# Patient Record
Sex: Female | Born: 1935 | Race: White | Hispanic: No | Smoking: Never smoker
Health system: Southern US, Community
[De-identification: ages and names within clinical notes are randomized; demographics above are authoritative.]

## PROBLEM LIST (undated history)

## (undated) DIAGNOSIS — H8109 Meniere's disease, unspecified ear: Secondary | ICD-10-CM

## (undated) DIAGNOSIS — E7800 Pure hypercholesterolemia, unspecified: Secondary | ICD-10-CM

## (undated) DIAGNOSIS — F329 Major depressive disorder, single episode, unspecified: Secondary | ICD-10-CM

## (undated) DIAGNOSIS — M810 Age-related osteoporosis without current pathological fracture: Secondary | ICD-10-CM

## (undated) DIAGNOSIS — F419 Anxiety disorder, unspecified: Secondary | ICD-10-CM

## (undated) DIAGNOSIS — F32A Depression, unspecified: Secondary | ICD-10-CM

## (undated) DIAGNOSIS — I10 Essential (primary) hypertension: Secondary | ICD-10-CM

---

## 2005-06-19 ENCOUNTER — Ambulatory Visit: Payer: Self-pay | Admitting: Family Medicine

## 2005-11-15 ENCOUNTER — Emergency Department (HOSPITAL_COMMUNITY): Admission: EM | Admit: 2005-11-15 | Discharge: 2005-11-15 | Payer: Self-pay | Admitting: Emergency Medicine

## 2007-11-09 IMAGING — CR DG SHOULDER 2+V*R*
3 series · 3 of 3 positions shown · non-contrast
Comparison: none

CLINICAL DATA: Right shoulder pain.
 RIGHT SHOULDER ? 3 VIEW:

[view not recorded (1 of 3)]
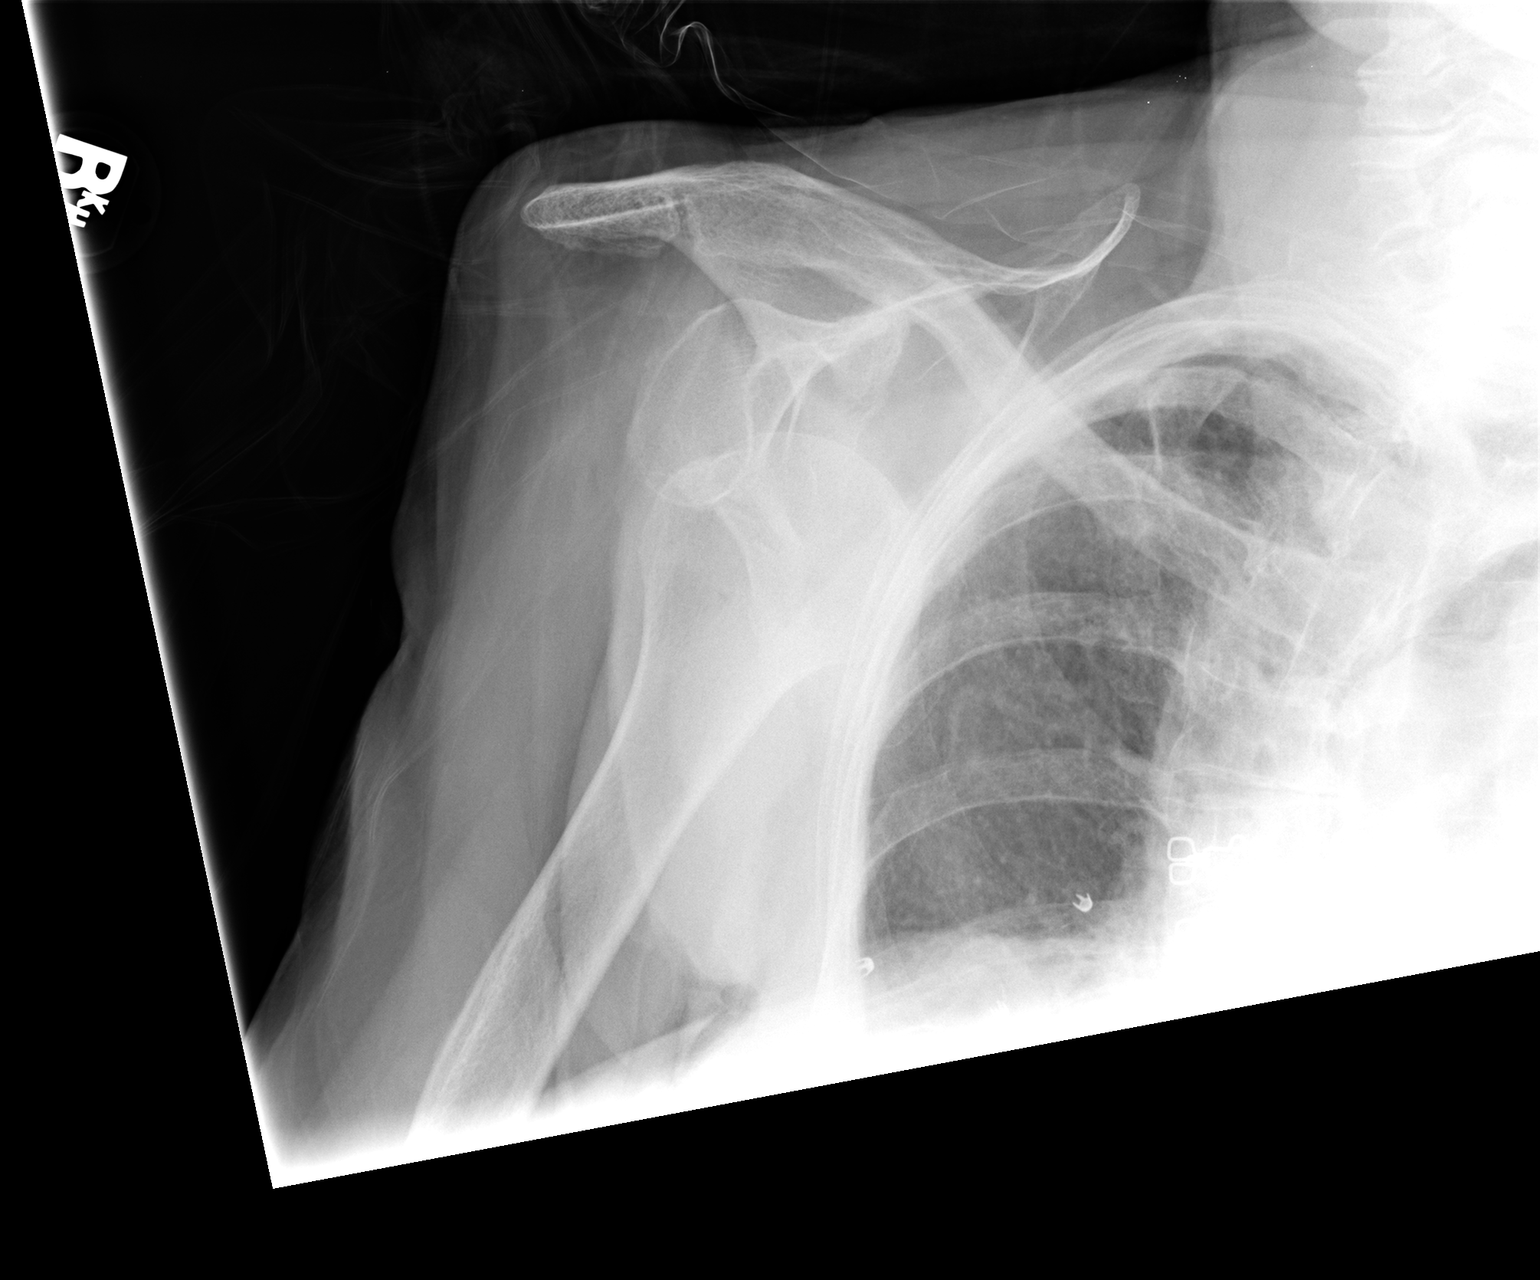

[view not recorded (2 of 3)]
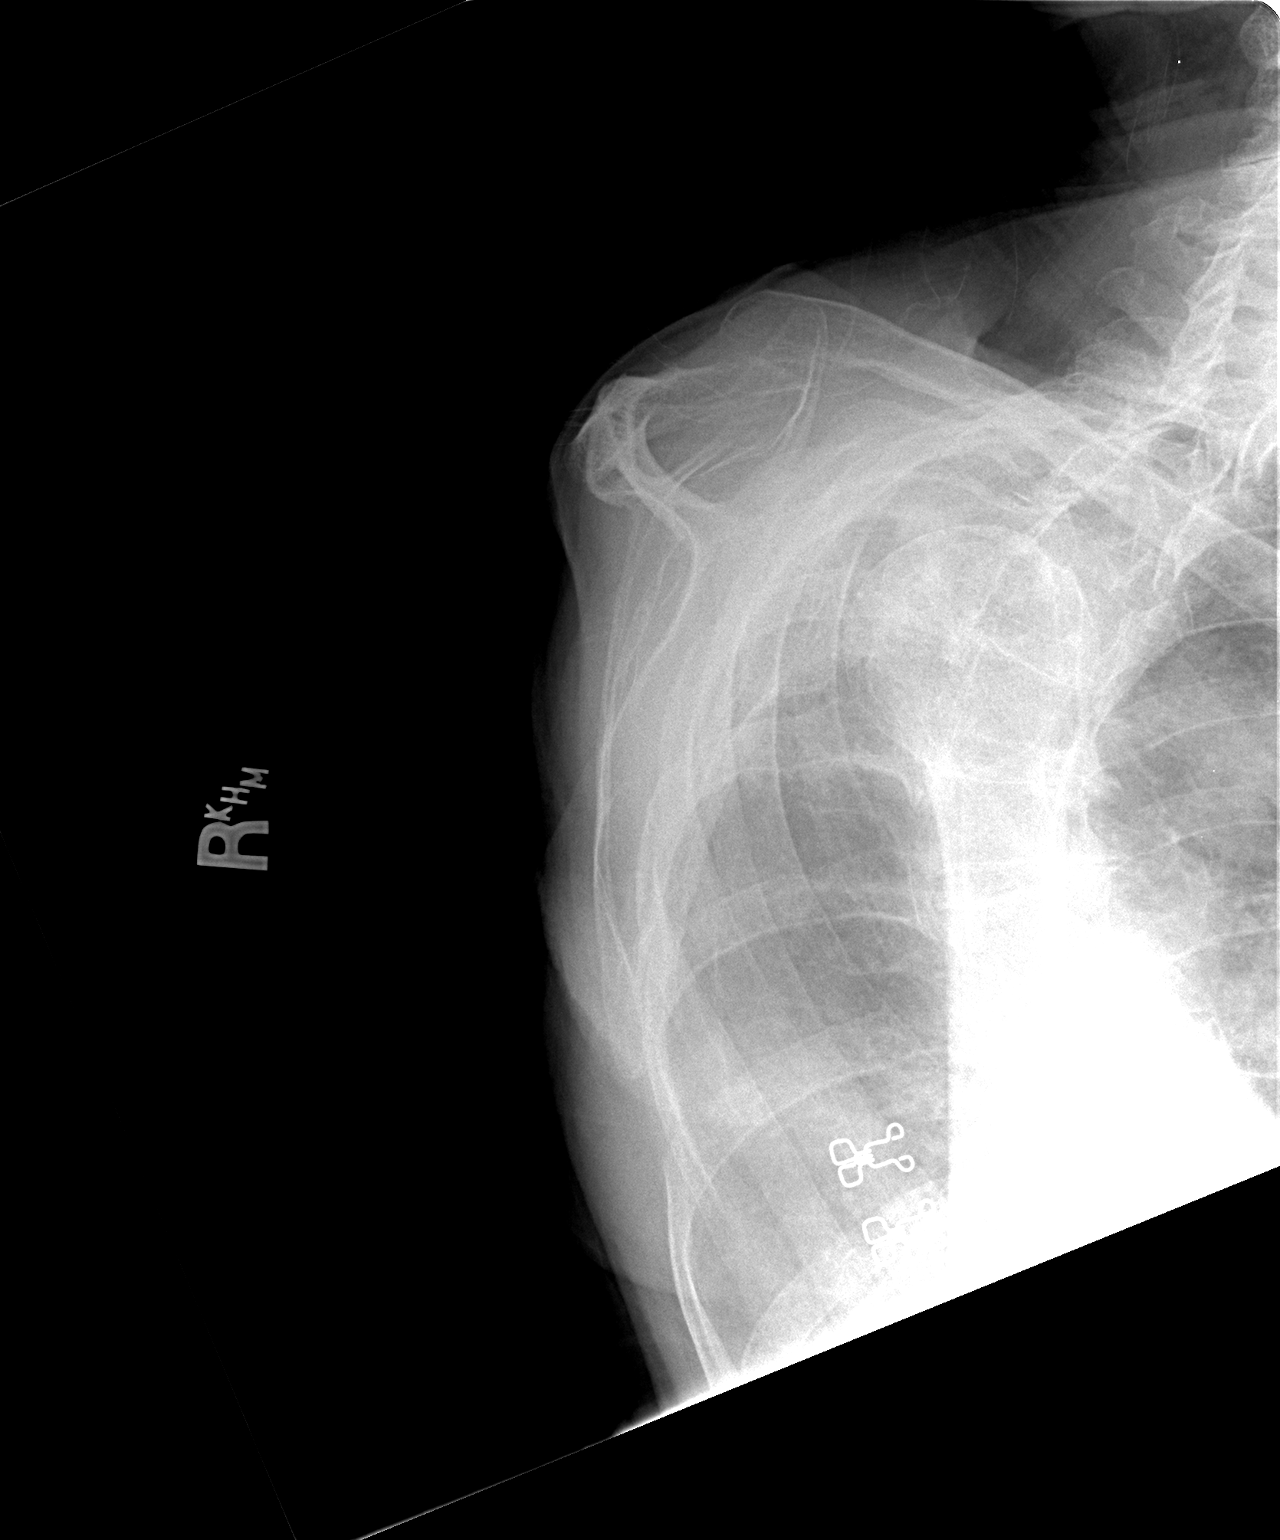

[view not recorded (3 of 3)]
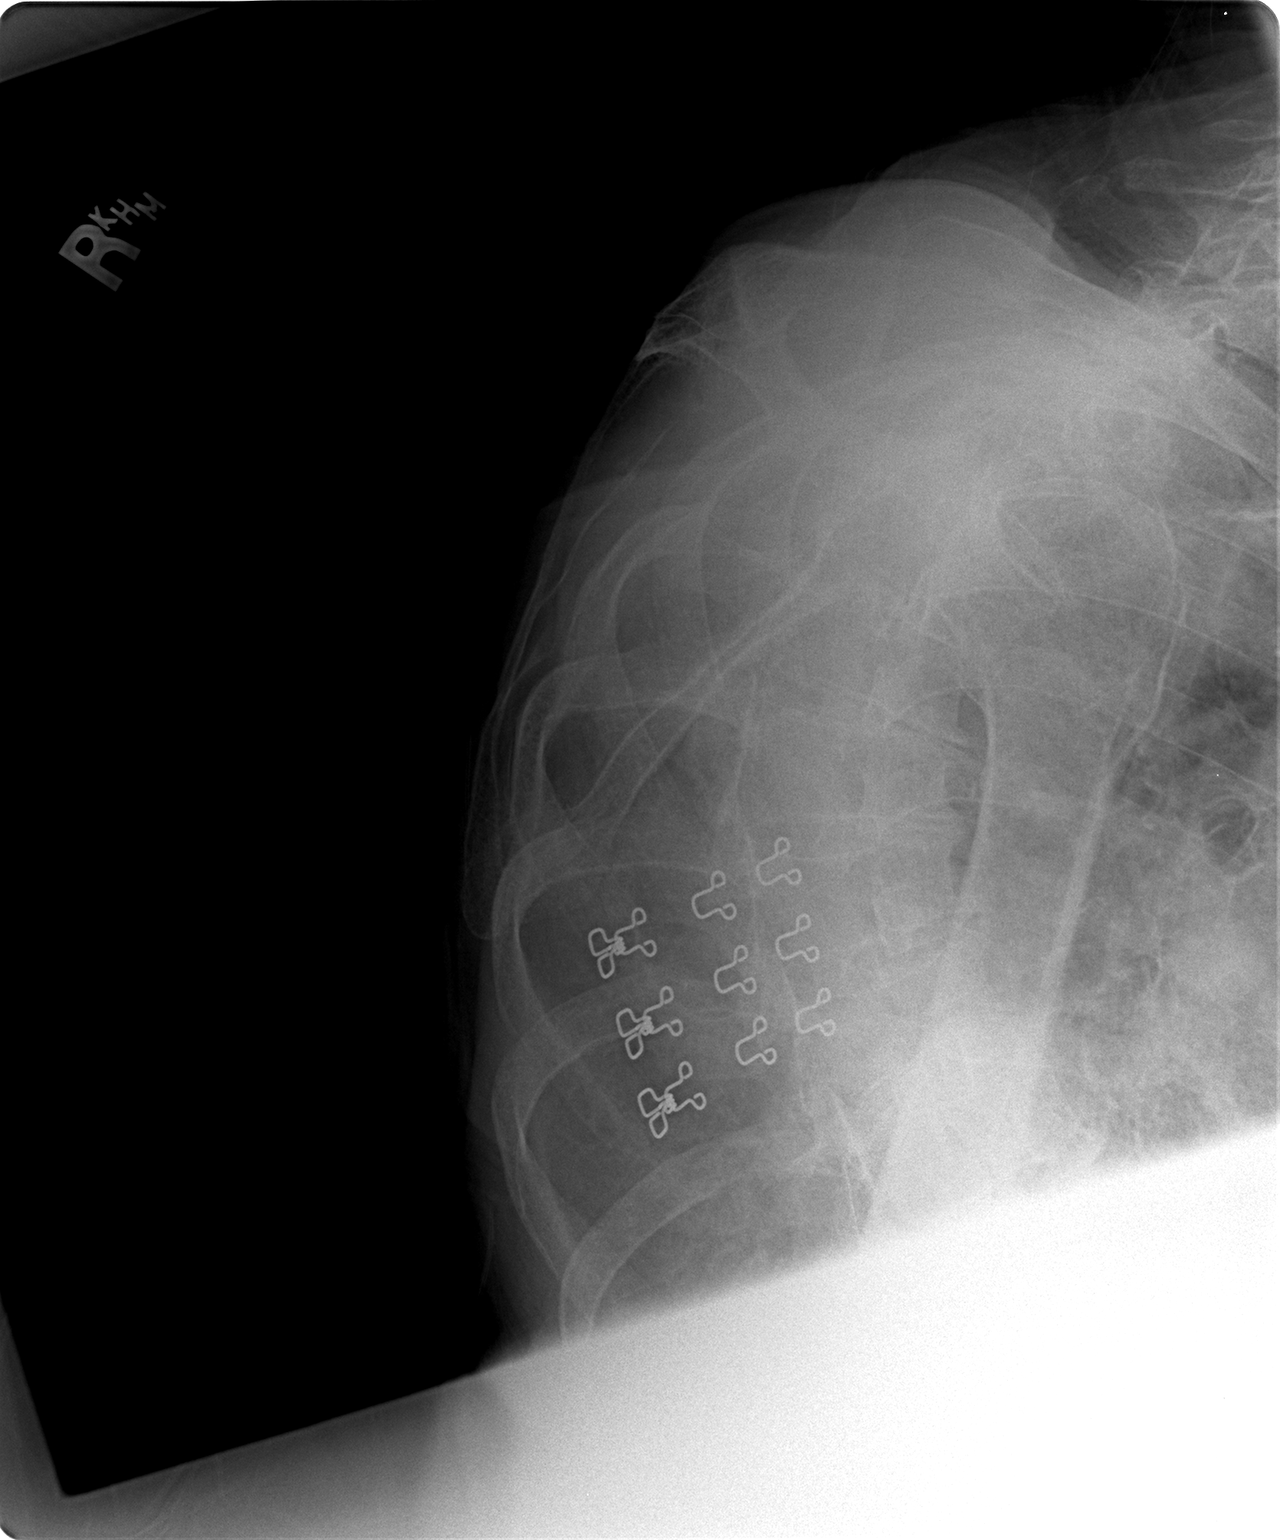

[3 of 3 positions shown; findings below may reference images not displayed]

FINDINGS: Anterior subcoracoid dislocation in the right humeral head.  No fracture.
IMPRESSION: Anterior dislocation of the right shoulder.

## 2016-12-12 ENCOUNTER — Encounter (HOSPITAL_COMMUNITY): Payer: Self-pay | Admitting: Emergency Medicine

## 2016-12-12 ENCOUNTER — Emergency Department (HOSPITAL_COMMUNITY)
Admission: EM | Admit: 2016-12-12 | Discharge: 2016-12-13 | Disposition: A | Discharge status: Home / Self Care | Payer: Medicare Other | Attending: Emergency Medicine | Admitting: Emergency Medicine

## 2016-12-12 ENCOUNTER — Emergency Department (HOSPITAL_COMMUNITY): Payer: Medicare Other

## 2016-12-12 DIAGNOSIS — I509 Heart failure, unspecified: Secondary | ICD-10-CM | POA: Diagnosis not present

## 2016-12-12 DIAGNOSIS — I110 Hypertensive heart disease with heart failure: Secondary | ICD-10-CM | POA: Insufficient documentation

## 2016-12-12 DIAGNOSIS — R0602 Shortness of breath: Secondary | ICD-10-CM | POA: Diagnosis present

## 2016-12-12 DIAGNOSIS — Z79899 Other long term (current) drug therapy: Secondary | ICD-10-CM | POA: Diagnosis not present

## 2016-12-12 HISTORY — DX: Major depressive disorder, single episode, unspecified: F32.9

## 2016-12-12 HISTORY — DX: Anxiety disorder, unspecified: F41.9

## 2016-12-12 HISTORY — DX: Age-related osteoporosis without current pathological fracture: M81.0

## 2016-12-12 HISTORY — DX: Depression, unspecified: F32.A

## 2016-12-12 HISTORY — DX: Meniere's disease, unspecified ear: H81.09

## 2016-12-12 HISTORY — DX: Pure hypercholesterolemia, unspecified: E78.00

## 2016-12-12 HISTORY — DX: Essential (primary) hypertension: I10

## 2016-12-12 LAB — CBC WITH DIFFERENTIAL/PLATELET
BASOS ABS: 0 10*3/uL (ref 0.0–0.1)
BASOS PCT: 0 %
EOS ABS: 0 10*3/uL (ref 0.0–0.7)
Eosinophils Relative: 0 %
HCT: 41.9 % (ref 36.0–46.0)
HEMOGLOBIN: 13.5 g/dL (ref 12.0–15.0)
Lymphocytes Relative: 6 %
Lymphs Abs: 0.5 10*3/uL — ABNORMAL LOW (ref 0.7–4.0)
MCH: 32.1 pg (ref 26.0–34.0)
MCHC: 32.2 g/dL (ref 30.0–36.0)
MCV: 99.5 fL (ref 78.0–100.0)
Monocytes Absolute: 0.4 10*3/uL (ref 0.1–1.0)
Monocytes Relative: 5 %
NEUTROS PCT: 89 %
Neutro Abs: 6.8 10*3/uL (ref 1.7–7.7)
PLATELETS: 224 10*3/uL (ref 150–400)
RBC: 4.21 MIL/uL (ref 3.87–5.11)
RDW: 13.4 % (ref 11.5–15.5)
WBC: 7.7 10*3/uL (ref 4.0–10.5)

## 2016-12-12 LAB — BASIC METABOLIC PANEL
Anion gap: 11 (ref 5–15)
BUN: 13 mg/dL (ref 6–20)
CHLORIDE: 102 mmol/L (ref 101–111)
CO2: 28 mmol/L (ref 22–32)
CREATININE: 0.75 mg/dL (ref 0.44–1.00)
Calcium: 9.6 mg/dL (ref 8.9–10.3)
Glucose, Bld: 128 mg/dL — ABNORMAL HIGH (ref 65–99)
POTASSIUM: 3.8 mmol/L (ref 3.5–5.1)
SODIUM: 141 mmol/L (ref 135–145)

## 2016-12-12 LAB — TROPONIN I
TROPONIN I: 0.03 ng/mL — AB (ref <0.03)
Troponin I: 0.03 ng/mL (ref <0.03)

## 2016-12-12 LAB — BRAIN NATRIURETIC PEPTIDE: B NATRIURETIC PEPTIDE 5: 211 pg/mL — AB (ref 0.0–100.0)

## 2016-12-12 MED ORDER — ALBUTEROL SULFATE (2.5 MG/3ML) 0.083% IN NEBU: 5.0000 mg | INHALATION_SOLUTION | Freq: Once | RESPIRATORY_TRACT | Status: DC

## 2016-12-12 MED ORDER — FUROSEMIDE 20 MG PO TABS: 20.0000 mg | ORAL_TABLET | Freq: Every day | ORAL | 0 refills | Status: AC

## 2016-12-12 MED ORDER — RAMIPRIL 10 MG PO CAPS
10.0000 mg | ORAL_CAPSULE | Freq: Once | ORAL | Status: AC
  Administered 2016-12-12: 10 mg via ORAL
  Filled 2016-12-12: qty 1
  Filled 2016-12-12: qty 2

## 2016-12-12 MED ORDER — RAMIPRIL 10 MG PO CAPS: 10.0000 mg | ORAL_CAPSULE | Freq: Every day | ORAL | 0 refills | Status: AC

## 2016-12-12 MED ORDER — SIMVASTATIN 20 MG PO TABS: 20.0000 mg | ORAL_TABLET | Freq: Every day | ORAL | 0 refills | Status: AC

## 2016-12-12 MED ORDER — SIMVASTATIN 10 MG PO TABS: 20.0000 mg | ORAL_TABLET | Freq: Every day | ORAL | Status: DC

## 2016-12-12 MED ORDER — SIMVASTATIN 10 MG PO TABS
20.0000 mg | ORAL_TABLET | Freq: Once | ORAL | Status: AC
  Administered 2016-12-12: 20 mg via ORAL
  Filled 2016-12-12: qty 2

## 2016-12-12 MED ORDER — FUROSEMIDE 40 MG PO TABS
20.0000 mg | ORAL_TABLET | Freq: Once | ORAL | Status: DC
Start: 2016-12-12 — End: 2016-12-13
  Filled 2016-12-12: qty 1

## 2016-12-12 NOTE — ED Provider Notes (Signed)
[facility] EMERGENCY DEPARTMENT Provider Note   CSN: [id number] Arrival date & time: 12/12/16  1707     History   Chief Complaint Chief Complaint  Patient presents with  . Shortness of Breath    HPI Tanya Caldwell is a 83 y.o. female.  Patient is unsure why she is here but her son states that she had about a 2-hour episode earlier of shortness of breath associated with some nausea.   The history is provided by a relative.  Shortness of Breath  This is a new problem. The average episode lasts 2 hours. The problem occurs rarely.The current episode started 3 to 5 hours ago. The problem has been resolved. Associated symptoms include vomiting. Pertinent negatives include no fever and no cough.    Past Medical History:  Diagnosis Date  . Anxiety   . Depression   . High cholesterol   . Hypertension   . Meniere disease   . Osteoporosis     There are no active problems to display for this patient.   History reviewed. No pertinent surgical history.  OB History    No data available       Home Medications    Prior to Admission medications   Medication Sig Start Date End Date Taking? Authorizing Provider  ramipril (ALTACE) 10 MG capsule Take 10 mg by mouth daily.   Yes [provider]  simvastatin (ZOCOR) 20 MG tablet Take 20 mg by mouth daily.  01/08/16  Yes [provider]  furosemide (LASIX) 20 MG tablet Take 1 tablet (20 mg total) by mouth daily. 12/12/16 12/15/16  Kalief Kattner, Barbara Cower, MD  ramipril (ALTACE) 10 MG capsule Take 1 capsule (10 mg total) by mouth daily. 12/12/16   Jurney Overacker, Barbara Cower, MD  simvastatin (ZOCOR) 20 MG tablet Take 1 tablet (20 mg total) by mouth daily. 12/12/16   Julianne Chamberlin, Barbara Cower, MD    Family History History reviewed. No pertinent family history.  Social History Social History  Substance Use Topics  . Smoking status: Never Smoker  . Smokeless tobacco: Never Used  . Alcohol use No     Allergies   Atenolol and  Verapamil   Review of Systems Review of Systems  Constitutional: Negative for fever.  Respiratory: Positive for shortness of breath. Negative for cough.   Gastrointestinal: Positive for vomiting.  All other systems reviewed and are negative.    Physical Exam Updated Vital Signs BP (!) 152/99 (BP Location: Left Arm)   Pulse 68   Temp 98.2 F (36.8 C) (Oral)   Resp 18   Ht 5\' 2"  (1.575 m)   Wt 40.4 kg (89 lb)   SpO2 96%   BMI 16.28 kg/m   Physical Exam  Constitutional: She is oriented to person, place, and time. She appears well-developed and well-nourished.  HENT:  Head: Normocephalic and atraumatic.  Eyes: Conjunctivae and EOM are normal.  Neck: Normal range of motion.  Cardiovascular: Normal rate and regular rhythm.   No murmur heard. Pulmonary/Chest: Effort normal and breath sounds normal. No stridor. No respiratory distress.  Abdominal: Soft. She exhibits no distension.  Neurological: She is alert and oriented to person, place, and time. No cranial nerve deficit. Coordination normal.  Skin: Skin is warm and dry. No erythema. No pallor.  Nursing note and vitals reviewed.    ED Treatments / Results  Labs (all labs ordered are listed, but only abnormal results are displayed) Labs Reviewed  CBC WITH DIFFERENTIAL/PLATELET - Abnormal; Notable for the following:  Result Value   Lymphs Abs 0.5 (*)    All other components within normal limits  BASIC METABOLIC PANEL - Abnormal; Notable for the following:    Glucose, Bld 128 (*)    All other components within normal limits  BRAIN NATRIURETIC PEPTIDE - Abnormal; Notable for the following:    B Natriuretic Peptide 211.0 (*)    All other components within normal limits  TROPONIN I - Abnormal; Notable for the following:    Troponin I 0.03 (*)    All other components within normal limits  TROPONIN I - Abnormal; Notable for the following:    Troponin I 0.03 (*)    All other components within normal limits     EKG  EKG Interpretation  Date/Time:  Friday December 12 2016 17:48:43 EDT Ventricular Rate:  74 PR Interval:  220 QRS Duration: 76 QT Interval:  406 QTC Calculation: 450 R Axis:   60 Text Interpretation:  Sinus rhythm with 1st degree A-V block Septal infarct , age undetermined ST & T wave abnormality, consider inferolateral ischemia Abnormal ECG No old tracing to compare Confirmed by Marily MemosMesner, Tanveer Dobberstein [phone]([id number]) on 12/12/2016 9:20:53 PM       Radiology Dg Chest 2 View  Result Date: 12/12/2016 CLINICAL DATA:  Shortness of breath. EXAM: CHEST  2 VIEW COMPARISON:  June 19, 2005 FINDINGS: Chronic changes in the lateral left lung base. The heart, hila, mediastinum, lungs, and pleura are otherwise stable with no acute abnormalities. Severe scoliosis. IMPRESSION: No active cardiopulmonary disease. Electronically Signed   By: Gerome Samavid  Williams III M.D   On: 12/12/2016 18:28    Procedures Procedures (including critical care time)  Medications Ordered in ED Medications  furosemide (LASIX) tablet 20 mg (not administered)  ramipril (ALTACE) capsule 10 mg (not administered)  simvastatin (ZOCOR) tablet 20 mg (not administered)     Initial Impression / Assessment and Plan / ED Course  I have reviewed the triage vital signs and the nursing notes.  Pertinent labs & imaging results that were available during my care of the patient were reviewed by me and considered in my medical decision making (see chart for details).     EKG without any evidence of acute infarct.  She has a history of heart failure and labs are consistent with the same.  We will give her soft diuresis and have her follow with her primary doctor otherwise will return here for any worsening symptoms.  She was chest pain-free the entire time she was here she had no shortness of breath.  She ambulated without dyspnea, CP or hypoxia.  Low suspicion for acute coronary event at this time.  Final Clinical Impressions(s) / ED Diagnoses    Final diagnoses:  Acute on chronic congestive heart failure, unspecified heart failure type (HCC)    New Prescriptions New Prescriptions   FUROSEMIDE (LASIX) 20 MG TABLET    Take 1 tablet (20 mg total) by mouth daily.   RAMIPRIL (ALTACE) 10 MG CAPSULE    Take 1 capsule (10 mg total) by mouth daily.   SIMVASTATIN (ZOCOR) 20 MG TABLET    Take 1 tablet (20 mg total) by mouth daily.     Marily MemosMesner, Lezley Bedgood, MD 12/12/16 [phone]

## 2016-12-12 NOTE — ED Notes (Signed)
Date and time results received: 12/12/16 8:59 PM  Test: troponin Critical Value: 0.03  Name of Provider Notified: dr.[email or web address]   Orders Received? Or Actions Taken?: MD notified

## 2016-12-12 NOTE — ED Triage Notes (Addendum)
Per patient's son, patient was complaining of shortness of breath and nausea 2-3 hours prior to arrival to ER. Patient states she does not know why she is here. Denies pain. Son is unsure of patient's history, states "she stays with my sister but she's in [place]."

## 2016-12-12 NOTE — ED Notes (Signed)
Pt O2 did not fall below 90% while ambulating, steady gait, stand by assist.

## 2017-06-24 DEATH — deceased
